# Patient Record
Sex: Female | Born: 1945 | Race: White | Hispanic: No | Marital: Married | Smoking: Former smoker
Health system: Southern US, Community
[De-identification: ages and names within clinical notes are randomized; demographics above are authoritative.]

## PROBLEM LIST (undated history)

## (undated) DIAGNOSIS — M1990 Unspecified osteoarthritis, unspecified site: Secondary | ICD-10-CM

## (undated) DIAGNOSIS — K219 Gastro-esophageal reflux disease without esophagitis: Secondary | ICD-10-CM

## (undated) DIAGNOSIS — M25559 Pain in unspecified hip: Secondary | ICD-10-CM

## (undated) DIAGNOSIS — R32 Unspecified urinary incontinence: Secondary | ICD-10-CM

## (undated) DIAGNOSIS — H3530 Unspecified macular degeneration: Secondary | ICD-10-CM

## (undated) DIAGNOSIS — J439 Emphysema, unspecified: Secondary | ICD-10-CM

## (undated) DIAGNOSIS — I10 Essential (primary) hypertension: Secondary | ICD-10-CM

## (undated) DIAGNOSIS — J449 Chronic obstructive pulmonary disease, unspecified: Secondary | ICD-10-CM

## (undated) DIAGNOSIS — G8929 Other chronic pain: Secondary | ICD-10-CM

## (undated) DIAGNOSIS — R609 Edema, unspecified: Secondary | ICD-10-CM

## (undated) DIAGNOSIS — C801 Malignant (primary) neoplasm, unspecified: Secondary | ICD-10-CM

## (undated) DIAGNOSIS — E785 Hyperlipidemia, unspecified: Secondary | ICD-10-CM

## (undated) HISTORY — DX: Unspecified urinary incontinence: R32

## (undated) HISTORY — DX: Hyperlipidemia, unspecified: E78.5

## (undated) HISTORY — PX: CHOLECYSTECTOMY: SHX55

## (undated) HISTORY — PX: ADENOIDECTOMY: SUR15

## (undated) HISTORY — DX: Emphysema, unspecified: J43.9

## (undated) HISTORY — PX: TONSILLECTOMY: SUR1361

---

## 1996-04-29 HISTORY — PX: ROTATOR CUFF REPAIR: SHX139

## 2009-04-29 HISTORY — PX: WHIPPLE PROCEDURE: SHX2667

## 2017-02-06 DIAGNOSIS — D126 Benign neoplasm of colon, unspecified: Secondary | ICD-10-CM | POA: Insufficient documentation

## 2018-06-07 ENCOUNTER — Emergency Department (HOSPITAL_COMMUNITY): Payer: Medicare Other

## 2018-06-07 ENCOUNTER — Encounter (HOSPITAL_COMMUNITY): Payer: Self-pay | Admitting: Emergency Medicine

## 2018-06-07 ENCOUNTER — Other Ambulatory Visit: Payer: Self-pay

## 2018-06-07 ENCOUNTER — Emergency Department (HOSPITAL_COMMUNITY)
Admission: EM | Admit: 2018-06-07 | Discharge: 2018-06-07 | Disposition: A | Discharge status: Home / Self Care | Payer: Medicare Other | Attending: Emergency Medicine | Admitting: Emergency Medicine

## 2018-06-07 DIAGNOSIS — I10 Essential (primary) hypertension: Secondary | ICD-10-CM | POA: Insufficient documentation

## 2018-06-07 DIAGNOSIS — Z859 Personal history of malignant neoplasm, unspecified: Secondary | ICD-10-CM | POA: Insufficient documentation

## 2018-06-07 DIAGNOSIS — R0602 Shortness of breath: Secondary | ICD-10-CM | POA: Diagnosis present

## 2018-06-07 DIAGNOSIS — J449 Chronic obstructive pulmonary disease, unspecified: Secondary | ICD-10-CM | POA: Insufficient documentation

## 2018-06-07 DIAGNOSIS — Z87891 Personal history of nicotine dependence: Secondary | ICD-10-CM | POA: Diagnosis not present

## 2018-06-07 DIAGNOSIS — J441 Chronic obstructive pulmonary disease with (acute) exacerbation: Secondary | ICD-10-CM | POA: Diagnosis not present

## 2018-06-07 DIAGNOSIS — Z79899 Other long term (current) drug therapy: Secondary | ICD-10-CM | POA: Insufficient documentation

## 2018-06-07 HISTORY — DX: Chronic obstructive pulmonary disease, unspecified: J44.9

## 2018-06-07 HISTORY — DX: Malignant (primary) neoplasm, unspecified: C80.1

## 2018-06-07 HISTORY — DX: Unspecified osteoarthritis, unspecified site: M19.90

## 2018-06-07 HISTORY — DX: Gastro-esophageal reflux disease without esophagitis: K21.9

## 2018-06-07 HISTORY — DX: Unspecified macular degeneration: H35.30

## 2018-06-07 HISTORY — DX: Other chronic pain: G89.29

## 2018-06-07 HISTORY — DX: Edema, unspecified: R60.9

## 2018-06-07 HISTORY — DX: Essential (primary) hypertension: I10

## 2018-06-07 HISTORY — DX: Pain in unspecified hip: M25.559

## 2018-06-07 LAB — CBC
HCT: 41.3 % (ref 36.0–46.0)
Hemoglobin: 12.4 g/dL (ref 12.0–15.0)
MCH: 27.3 pg (ref 26.0–34.0)
MCHC: 30 g/dL (ref 30.0–36.0)
MCV: 90.8 fL (ref 80.0–100.0)
Platelets: 282 10*3/uL (ref 150–400)
RBC: 4.55 MIL/uL (ref 3.87–5.11)
RDW: 14.2 % (ref 11.5–15.5)
WBC: 5.6 10*3/uL (ref 4.0–10.5)
nRBC: 0 % (ref 0.0–0.2)

## 2018-06-07 LAB — BASIC METABOLIC PANEL
Anion gap: 12 (ref 5–15)
BUN: 27 mg/dL — ABNORMAL HIGH (ref 8–23)
CO2: 22 mmol/L (ref 22–32)
Calcium: 9.2 mg/dL (ref 8.9–10.3)
Chloride: 105 mmol/L (ref 98–111)
Creatinine, Ser: 0.79 mg/dL (ref 0.44–1.00)
GFR calc Af Amer: >60 mL/min (ref >60)
GFR calc non Af Amer: >60 mL/min (ref >60)
Glucose, Bld: 106 mg/dL — ABNORMAL HIGH (ref 70–99)
Potassium: 4.3 mmol/L (ref 3.5–5.1)
Sodium: 139 mmol/L (ref 135–145)

## 2018-06-07 LAB — BRAIN NATRIURETIC PEPTIDE: B Natriuretic Peptide: 49 pg/mL (ref 0.0–100.0)

## 2018-06-07 LAB — TROPONIN I: Troponin I: <0.03 ng/mL (ref <0.03)

## 2018-06-07 MED ORDER — ALBUTEROL SULFATE (2.5 MG/3ML) 0.083% IN NEBU: 5.0000 mg | INHALATION_SOLUTION | Freq: Once | RESPIRATORY_TRACT | Status: DC

## 2018-06-07 NOTE — ED Triage Notes (Signed)
Patient c/o shortness of breath that started today. Per patient hx of COPD and borderline CHF. Patient uses nebulizer treatments at home-last used approx 1 hour prior to coming to ED. Denies any chest pain. Patient states "I have been slack on using my neb treatments because I have been doing so well." Occasional cough reported. Patient does have bilateral swelling in lower legs.

## 2018-06-07 NOTE — Discharge Instructions (Signed)
Use your nebulizer treatment that you have at home every 6 hours for the next 7 days at least.  Today's work-up without evidence of pneumonia.  No evidence of any fluid on the lungs no evidence of congestive heart failure.  Follow-up with your doctor for any new or worse symptoms.

## 2018-06-07 NOTE — ED Provider Notes (Signed)
[facility] EMERGENCY DEPARTMENT Provider Note   CSN: [id number] Arrival date & time: 06/07/18  1753     History   Chief Complaint Chief Complaint  Patient presents with  . Shortness of Breath    HPI Vicki Kennedy is a 73 y.o. female.  Patient had a sudden episode of shortness of breath earlier today.  She has had some leg swelling to when she was worried about congestive heart failure she is been told in the past she has borderline congestive heart failure.  She also has a history of COPD.  Patient did use a nebulizer treatment about 1 hour prior to coming and does feel better.  Denies any chest pain.  Patient states she has not been good about using her nebulizers like she is supposed to.     Past Medical History:  Diagnosis Date  . Arthritis   . Cancer ([place])   . Chronic hip pain   . COPD (chronic obstructive pulmonary disease) ([place])   . Edema   . GERD (gastroesophageal reflux disease)   . Hypertension   . Macular degeneration     There are no active problems to display for this patient.   Past Surgical History:  Procedure Laterality Date  . ADENOIDECTOMY    . CHOLECYSTECTOMY    . TONSILLECTOMY       OB History    Gravida  1   Para  1   Term  1   Preterm      AB      Living  1     SAB      TAB      Ectopic      Multiple      Live Births               Home Medications    Prior to Admission medications   Medication Sig Start Date End Date Taking? Authorizing Provider  amLODipine-benazepril (LOTREL) 5-20 MG capsule Take 1 capsule by mouth daily. 05/05/18  Yes [provider]  B Complex Vitamins (VITAMIN B-COMPLEX) TABS Take 1 tablet by mouth daily.   Yes [provider]  Biotin 1000 MCG tablet Take 1 tablet by mouth daily.   Yes [provider]  budesonide (PULMICORT) 0.5 MG/2ML nebulizer solution Inhale 0.5 mg into the lungs 2 (two) times daily.   Yes [provider]  bumetanide (BUMEX) 1 MG tablet  Take 1 tablet by mouth daily. 05/20/18  Yes [provider]  buprenorphine (BUTRANS) 5 MCG/HR [place] 1 patch onto the skin once a week. 05/29/18  Yes [provider]  Cholecalciferol (VITAMIN D-1000 MAX ST) 25 MCG (1000 UT) tablet Take 1 tablet by mouth daily.   Yes [provider]  clonazePAM (KLONOPIN) 0.5 MG tablet Take 1 tablet by mouth 2 (two) times daily. 05/27/18  Yes [provider]  diclofenac sodium (VOLTAREN) 1 % GEL Apply 1 application topically 2 (two) times daily as needed for pain. 02/26/18  Yes [provider]  FLUoxetine (PROZAC) 40 MG capsule Take 1 capsule by mouth daily. 03/29/18  Yes [provider]  furosemide (LASIX) 40 MG tablet Take 1 tablet by mouth 2 (two) times daily. 04/27/18  Yes [provider]  gabapentin (NEURONTIN) 300 MG capsule Take 1 capsule by mouth 3 (three) times daily. 05/27/18  Yes [provider]  rOPINIRole (REQUIP) 0.5 MG tablet Take 1 tablet by mouth daily. 04/08/18  Yes [provider]  traMADol (ULTRAM) 50 MG tablet  Take 1 tablet by mouth 3 (three) times daily. 06/05/18  Yes [provider]    Family History Family History  Problem Relation Age of Onset  . Cancer Father   . Cancer Other   . Asthma Other   . Heart failure Other     Social History Social History   Tobacco Use  . Smoking status: Former Smoker    Packs/day: 1.00    Years: 40.00    Pack years: 40.00    Types: Cigarettes    Last attempt to quit: 09/17/2009    Years since quitting: 8.7  . Smokeless tobacco: Never Used  Substance Use Topics  . Alcohol use: Never    Frequency: Never  . Drug use: Never     Allergies   Aspirin; Demerol [meperidine hcl]; Ibuprofen; and Ivp dye [iodinated diagnostic agents]   Review of Systems Review of Systems  Constitutional: Negative for chills and fever.  HENT: Negative for rhinorrhea and sore throat.   Eyes: Negative for visual disturbance.    Respiratory: Positive for shortness of breath. Negative for cough.   Cardiovascular: Positive for leg swelling. Negative for chest pain.  Gastrointestinal: Negative for abdominal pain, diarrhea, nausea and vomiting.  Genitourinary: Negative for dysuria.  Musculoskeletal: Negative for back pain and neck pain.  Skin: Negative for rash.  Neurological: Negative for dizziness, light-headedness and headaches.  Hematological: Does not bruise/bleed easily.  Psychiatric/Behavioral: Negative for confusion.     Physical Exam Updated Vital Signs BP (!) 106/48   Pulse 77   Resp (!) 22   Ht 1.575 m (5\' 2" )   Wt 90.3 kg   SpO2 96%   BMI 36.40 kg/m   Physical Exam Vitals signs and nursing note reviewed.  Constitutional:      General: She is not in acute distress.    Appearance: She is well-developed.  HENT:     Head: Normocephalic and atraumatic.     Nose: Congestion present.     Mouth/Throat:     Mouth: Mucous membranes are moist.  Eyes:     Conjunctiva/sclera: Conjunctivae normal.  Neck:     Musculoskeletal: Neck supple.  Cardiovascular:     Rate and Rhythm: Normal rate and regular rhythm.     Heart sounds: No murmur.  Pulmonary:     Effort: Pulmonary effort is normal. No respiratory distress.     Breath sounds: Normal breath sounds. No wheezing.  Abdominal:     General: Bowel sounds are normal.     Palpations: Abdomen is soft.     Tenderness: There is no abdominal tenderness.  Musculoskeletal: Normal range of motion.     Right lower leg: Edema present.     Left lower leg: Edema present.  Skin:    General: Skin is warm and dry.     Capillary Refill: Capillary refill takes less than 2 seconds.  Neurological:     General: No focal deficit present.     Mental Status: She is alert and oriented to person, place, and time.      ED Treatments / Results  Labs (all labs ordered are listed, but only abnormal results are displayed) Labs Reviewed  BASIC METABOLIC PANEL -  Abnormal; Notable for the following components:      Result Value   Glucose, Bld 106 (*)    BUN 27 (*)    All other components within normal limits  TROPONIN I  CBC  BRAIN NATRIURETIC PEPTIDE    EKG EKG Interpretation  Date/Time:  Sunday June 07 2018 18:38:27 EST Ventricular Rate:  77 PR Interval:    QRS Duration: 91 QT Interval:  398 QTC Calculation: 451 R Axis:   24 Text Interpretation:  Sinus rhythm Low voltage, precordial leads No previous ECGs available Interpretation limited secondary to artifact Confirmed by Fredia Sorrow [phone]) on 06/07/2018 6:43:00 PM   Radiology Dg Chest 2 View  Result Date: 06/07/2018 CLINICAL DATA:  Patient with shortness of breath. EXAM: CHEST - 2 VIEW COMPARISON:  None. FINDINGS: Monitoring leads overlie the patient. Cardiomegaly. Aortic atherosclerosis. Bibasilar heterogeneous opacities. No pleural effusion or pneumothorax. Age-indeterminate midthoracic spine compression deformity. IMPRESSION: Age-indeterminate compression deformity midthoracic spine, recommend correlation for point tenderness. Cardiomegaly. Basilar opacities which may represent atelectasis or infection. Electronically Signed   By: Lovey Newcomer M.D.   On: 06/07/2018 19:09   Ct Chest Wo Contrast  Result Date: 06/07/2018 CLINICAL DATA:  Shortness of breath, history of COPD EXAM: CT CHEST WITHOUT CONTRAST TECHNIQUE: Multidetector CT imaging of the chest was performed following the standard protocol without IV contrast. COMPARISON:  Chest x-ray from earlier in the same day. FINDINGS: Cardiovascular: Atherosclerotic calcification of the thoracic aorta and its branches are noted. No aneurysmal dilatation is seen. Heavy coronary calcifications are noted. No cardiac enlargement is seen. Mediastinum/Nodes: Thoracic inlet is within normal limits. No significant hilar or mediastinal adenopathy is noted. Scattered small mediastinal nodes are noted. The esophagus is within normal limits.  Lungs/Pleura: The lungs are well aerated bilaterally. No focal infiltrate or sizable effusion is noted. No sizable parenchymal nodule is seen. Some minimal linear density is noted in the left lower lobe posteriorly as well as in the right upper lobe likely related to scarring. No effusion or pneumothorax is seen. Upper Abdomen: Postsurgical changes are noted within the stomach. The gallbladder has been surgically removed. Renal cyst is noted on the right. Small anterior abdominal wall hernia is noted without incarceration. Musculoskeletal: Degenerative changes of the thoracic spine are noted. T7 compression fracture is noted which appears chronic. Old healed rib fractures are noted. No acute bony abnormality is seen. IMPRESSION: Chronic changes as described above.  No acute abnormality noted. Aortic Atherosclerosis (ICD10-I70.0). Electronically Signed   By: Inez Catalina M.D.   On: 06/07/2018 22:34    Procedures Procedures (including critical care time)  Medications Ordered in ED Medications - No data to display   Initial Impression / Assessment and Plan / ED Course  I have reviewed the triage vital signs and the nursing notes.  Pertinent labs & imaging results that were available during my care of the patient were reviewed by me and considered in my medical decision making (see chart for details).    Patient remained without any wheezing here at all.  Extensive work-up to evaluate for possible pneumonia or CHF.  Patient BNP was not elevated.  Chest x-ray raise some concern about may be early pneumonia.  CT of the chest though ruled that out.  Feel that patient probably an exacerbation of her COPD.  Recommend she use her nebulizer treatments every 6 hours that she has at home for the next 7 days and to follow-up with her doctor.  No evidence of any CHF.  Patient does have bilateral leg swelling.  But renal function was also normal.  She does have a follow-up appointment with her primary care doctor  regarding the leg swelling as well.  No clinical concern for pulmonary embolus.  Patient without any  tachycardia and oxygen saturations at all been in  the mid to upper 90s.  On room air.  Final Clinical Impressions(s) / ED Diagnoses   Final diagnoses:  COPD exacerbation [facility])    ED Discharge Orders    None       Fredia Sorrow, MD 06/07/18 2327

## 2018-06-07 NOTE — ED Notes (Signed)
Pt transported to Xray. 

## 2019-11-08 LAB — HEPATIC FUNCTION PANEL
ALT: 27 (ref 7–35)
AST: 32 (ref 13–35)
Alkaline Phosphatase: 156 — AB (ref 25–125)
Bilirubin, Total: 0.2

## 2019-11-08 LAB — COMPREHENSIVE METABOLIC PANEL
Albumin: 3.5 (ref 3.5–5.0)
Calcium: 8.2 — AB (ref 8.7–10.7)
GFR calc non Af Amer: 155

## 2019-11-08 LAB — LIPID PANEL
Cholesterol: 118 (ref 0–200)
HDL: 43 (ref 35–70)
LDL Cholesterol: 44
Triglycerides: 183 — AB (ref 40–160)

## 2019-11-08 LAB — BASIC METABOLIC PANEL
BUN: 14 (ref 4–21)
CO2: 22 (ref 13–22)
Chloride: 101 (ref 99–108)
Creatinine: 0.4 — AB (ref 0.5–1.1)
Glucose: 119
Potassium: 5.1 (ref 3.4–5.3)
Sodium: 134 — AB (ref 137–147)

## 2019-11-08 LAB — CBC AND DIFFERENTIAL
HCT: 38 (ref 36–46)
Platelets: 456 — AB (ref 150–399)
WBC: 6.3

## 2019-11-08 LAB — POCT ERYTHROCYTE SEDIMENTATION RATE, NON-AUTOMATED: Sed Rate: 38

## 2019-11-08 LAB — POCT INR: INR: 1.3 — AB (ref 0.9–1.1)

## 2019-11-11 ENCOUNTER — Telehealth: Payer: Self-pay | Admitting: General Practice

## 2019-11-11 LAB — CBC AND DIFFERENTIAL: Hemoglobin: 12.5 (ref 12.0–16.0)

## 2019-11-11 NOTE — Telephone Encounter (Signed)
Pt wants to know if you will accept her as a new patient. Her daughter in law is Estill Bamberg the NP student.

## 2019-11-23 ENCOUNTER — Ambulatory Visit (INDEPENDENT_AMBULATORY_CARE_PROVIDER_SITE_OTHER): Payer: Medicare Other | Admitting: Internal Medicine

## 2019-11-23 ENCOUNTER — Other Ambulatory Visit: Payer: Self-pay

## 2019-11-23 ENCOUNTER — Encounter: Payer: Self-pay | Admitting: Internal Medicine

## 2019-11-23 DIAGNOSIS — J411 Mucopurulent chronic bronchitis: Secondary | ICD-10-CM | POA: Diagnosis not present

## 2019-11-23 DIAGNOSIS — Z9181 History of falling: Secondary | ICD-10-CM | POA: Insufficient documentation

## 2019-11-23 DIAGNOSIS — J449 Chronic obstructive pulmonary disease, unspecified: Secondary | ICD-10-CM | POA: Insufficient documentation

## 2019-11-23 MED ORDER — ARFORMOTEROL TARTRATE 15 MCG/2ML IN NEBU: 15.0000 ug | INHALATION_SOLUTION | Freq: Two times a day (BID) | RESPIRATORY_TRACT | 1 refills | Status: DC

## 2019-11-23 MED ORDER — REVEFENACIN 175 MCG/3ML IN SOLN: 175.0000 ug | Freq: Every day | RESPIRATORY_TRACT | 1 refills | Status: DC

## 2019-11-23 NOTE — Patient Instructions (Signed)

## 2019-11-23 NOTE — Progress Notes (Signed)
Subjective:  Patient ID: Vicki Kennedy, female    DOB: 03/28/1946  Age: 74 y.o. MRN: [id number]  CC: COPD  NEW TO ME  HPI Vicki Kennedy presents for establishing is a primary care patient.  She comes in with her son and daughter-in-law.  She is had several admissions over the last few months for exacerbations of COPD.  She is currently using a Diskus device but tells me she does not think she is getting much of the ICS or LABA into her lungs.  She has also been using an inhaled corticosteroid through a nebulizer which she does not think is helped much.  During her recent admission she had a chest x-ray (11/10/19) that showed low lung volumes, normal heart size, coarse lung markings, no consolidation, no effusion no pneumothorax, and low bone density.  There are also some some degenerative changes in the spine and shoulders.  She continues to complain of cough that produces clear phlegm with wheezing and shortness of breath.  She has chronic left lower extremity edema that is felt related to a dislocated left hip.  She is trying to get a total hip replacement performed at a tertiary care center.  She and the family tell me she has been evaluated for CHF and pulmonary emboli and the work-up has been unremarkable.  There is no edema in the right lower extremity.  History Shandell has a past medical history of Arthritis, Cancer ([place]), Chronic hip pain, COPD (chronic obstructive pulmonary disease) ([place]), Edema, Emphysema lung ([place]), GERD (gastroesophageal reflux disease), Hyperlipidemia, Hypertension, Macular degeneration, and Urine incontinence.   She has a past surgical history that includes Cholecystectomy; Tonsillectomy; Adenoidectomy; Rotator cuff repair (Right, 1998); and Whipple procedure (2011).   Her family history includes Alcohol abuse in her father; Asthma in an other family member; COPD in her father; Cancer in her father and another family member; Hearing loss in her mother; Heart failure in an  other family member; Hypertension in her father.She reports that she quit smoking about 10 years ago. Her smoking use included cigarettes. She has a 40.00 pack-year smoking history. She has never used smokeless tobacco. She reports that she does not drink alcohol and does not use drugs.  Outpatient Medications Prior to Visit  Medication Sig Dispense Refill  . apixaban (ELIQUIS) 5 MG TABS tablet Take by mouth.    Marland Kitchen atorvastatin (LIPITOR) 40 MG tablet Take by mouth.    . denosumab (PROLIA) 60 MG/ML SOSY injection Inject into the skin.    Marland Kitchen omeprazole (PRILOSEC) 20 MG capsule Take by mouth.    . polyethylene glycol powder (GLYCOLAX/MIRALAX) 17 GM/SCOOP powder Take by mouth.    Marland Kitchen ipratropium-albuterol (DUONEB) 0.5-2.5 (3) MG/3ML SOLN Inhale into the lungs.    . B Complex Vitamins (VITAMIN B-COMPLEX) TABS Take 1 tablet by mouth daily.    . Biotin 1000 MCG tablet Take 1 tablet by mouth daily.    . bumetanide (BUMEX) 1 MG tablet Take 1 tablet by mouth daily.    . buprenorphine (BUTRANS) 5 MCG/HR PT[facility] Place 1 patch onto the skin once a week.    . calcium carbonate (OSCAL) 1500 (600 Ca) MG TABS tablet Take by mouth.    . Cholecalciferol (VITAMIN D-1000 MAX ST) 25 MCG (1000 UT) tablet Take 1 tablet by mouth daily.    . clonazePAM (KLONOPIN) 0.5 MG tablet Take 1 tablet by mouth 2 (two) times daily.    Marland Kitchen docusate sodium (COLACE) 100 MG capsule Take 100 mg by mouth  2 (two) times daily as needed.    Marland Kitchen FLUoxetine (PROZAC) 40 MG capsule Take 1 capsule by mouth daily.    . furosemide (LASIX) 40 MG tablet Take 1 tablet by mouth 2 (two) times daily.    Marland Kitchen gabapentin (NEURONTIN) 300 MG capsule Take 1 capsule by mouth 3 (three) times daily.    Marland Kitchen lactulose (CHRONULAC) 10 GM/15ML solution Take 10 g by mouth daily.    . metoprolol tartrate (LOPRESSOR) 25 MG tablet Take 25 mg by mouth 2 (two) times daily.    Marland Kitchen rOPINIRole (REQUIP) 0.5 MG tablet Take 1 tablet by mouth daily.    . traMADol (ULTRAM) 50 MG tablet Take 1  tablet by mouth 3 (three) times daily.    Marland Kitchen amLODipine-benazepril (LOTREL) 5-20 MG capsule Take 1 capsule by mouth daily.    Marland Kitchen aspirin-acetaminophen-caffeine (EXCEDRIN MIGRAINE) 250-250-65 MG tablet Take by mouth.    . budesonide (PULMICORT) 0.5 MG/2ML nebulizer solution Inhale 0.5 mg into the lungs 2 (two) times daily.    . diclofenac sodium (VOLTAREN) 1 % GEL Apply 1 application topically 2 (two) times daily as needed for pain.    Marland Kitchen Fluticasone-Salmeterol (ADVAIR) 250-50 MCG/DOSE AEPB Inhale into the lungs.     No facility-administered medications prior to visit.    ROS Review of Systems  Constitutional: Positive for fatigue. Negative for chills, diaphoresis and unexpected weight change.  HENT: Negative for trouble swallowing.   Eyes: Negative.   Respiratory: Positive for cough, shortness of breath and wheezing.   Cardiovascular: Positive for leg swelling. Negative for chest pain and palpitations.  Gastrointestinal: Negative for abdominal pain, constipation, diarrhea, nausea and vomiting.  Endocrine: Negative.   Genitourinary: Negative.  Negative for difficulty urinating.  Musculoskeletal: Positive for arthralgias. Negative for myalgias.  Skin: Negative.  Negative for color change and pallor.  Neurological: Positive for weakness. Negative for light-headedness and numbness.  Hematological: Negative for adenopathy. Does not bruise/bleed easily.  Psychiatric/Behavioral: Positive for dysphoric mood. Negative for behavioral problems, confusion, sleep disturbance and suicidal ideas. The patient is nervous/anxious.     Objective:  BP 116/70 (BP Location: Left Arm, Patient Position: Sitting, Cuff Size: Normal)   Pulse 73   Temp 98.9 F (37.2 C) (Oral)   Resp 16   Ht 5\' 2"  (1.575 m)   Wt 180 lb (81.6 kg)   SpO2 96%   BMI 32.92 kg/m   Physical Exam Vitals reviewed.  Constitutional:      Appearance: She is ill-appearing (frail, in a wheelchair).  HENT:     Nose: Nose normal.      Mouth/Throat:     Mouth: Mucous membranes are moist.  Eyes:     General: No scleral icterus.    Conjunctiva/sclera: Conjunctivae normal.  Cardiovascular:     Rate and Rhythm: Normal rate and regular rhythm.     Heart sounds: No murmur heard.  No gallop.   Pulmonary:     Effort: No tachypnea or accessory muscle usage.     Breath sounds: Normal air entry. Examination of the right-upper field reveals decreased breath sounds. Examination of the left-upper field reveals decreased breath sounds. Examination of the right-middle field reveals decreased breath sounds. Examination of the left-middle field reveals decreased breath sounds. Examination of the right-lower field reveals decreased breath sounds. Examination of the left-lower field reveals decreased breath sounds. Decreased breath sounds present. No wheezing, rhonchi or rales.  Abdominal:     General: Abdomen is flat.     Palpations: There is no  mass.     Tenderness: There is no abdominal tenderness. There is no guarding.  Musculoskeletal:     Cervical back: Neck supple.     Right lower leg: No edema.     Left lower leg: 2+ Edema present.  Lymphadenopathy:     Cervical: No cervical adenopathy.  Skin:    General: Skin is warm and dry.  Neurological:     Mental Status: She is alert. Mental status is at baseline.  Psychiatric:        Mood and Affect: Mood normal.        Behavior: Behavior normal.     Lab Results  Component Value Date   WBC 6.3 11/08/2019   HGB 12.5 11/11/2019   HCT 38 11/08/2019   PLT 456 (A) 11/08/2019   GLUCOSE 106 (H) 06/07/2018   CHOL 118 11/08/2019   TRIG 183 (A) 11/08/2019   HDL 43 11/08/2019   LDLCALC 44 11/08/2019   ALT 27 11/08/2019   AST 32 11/08/2019   NA 134 (A) 11/08/2019   K 5.1 11/08/2019   CL 101 11/08/2019   CREATININE 0.4 (A) 11/08/2019   BUN 14 11/08/2019   CO2 22 11/08/2019   INR 1.3 (A) 11/08/2019    Assessment & Plan:   Roisin was seen today for copd.  Diagnoses and all  orders for this visit:  Mucopurulent chronic bronchitis ([place])- I do not think she is benefiting from the Diskus device.  I recommended that she upgrade to a nebulized LABA/LAMA.  I recommended she continue using the inhaled corticosteroid to prevent exacerbations. -     arformoterol (BROVANA) 15 MCG/2ML NEBU; Take 2 mLs (15 mcg total) by nebulization in the morning and at bedtime. -     revefenacin (YUPELRI) 175 MCG/3ML nebulizer solution; Take 3 mLs (175 mcg total) by nebulization daily. -     Ambulatory referral to [place]   I have discontinued Hassan Rowan Lunz's budesonide, amLODipine-benazepril, diclofenac sodium, aspirin-acetaminophen-caffeine, Fluticasone-Salmeterol, and ipratropium-albuterol. I am also having her start on arformoterol and revefenacin. Additionally, I am having her maintain her buprenorphine, clonazePAM, Cholecalciferol, Biotin, Vitamin B-Complex, FLUoxetine, furosemide, gabapentin, bumetanide, rOPINIRole, traMADol, apixaban, atorvastatin, metoprolol tartrate, docusate sodium, lactulose, polyethylene glycol powder, denosumab, omeprazole, and calcium carbonate.  Meds ordered this encounter  Medications  . arformoterol (BROVANA) 15 MCG/2ML NEBU    Sig: Take 2 mLs (15 mcg total) by nebulization in the morning and at bedtime.    Dispense:  360 mL    Refill:  1  . revefenacin (YUPELRI) 175 MCG/3ML nebulizer solution    Sig: Take 3 mLs (175 mcg total) by nebulization daily.    Dispense:  270 mL    Refill:  1     Follow-up: Return in about 3 months (around 02/23/2020).  Scarlette Calico, MD

## 2019-11-24 ENCOUNTER — Encounter: Payer: Self-pay | Admitting: Internal Medicine

## 2019-12-29 ENCOUNTER — Other Ambulatory Visit: Payer: Self-pay | Admitting: Internal Medicine

## 2019-12-29 ENCOUNTER — Telehealth: Payer: Self-pay | Admitting: Internal Medicine

## 2019-12-29 DIAGNOSIS — I10 Essential (primary) hypertension: Secondary | ICD-10-CM

## 2019-12-29 DIAGNOSIS — M159 Polyosteoarthritis, unspecified: Secondary | ICD-10-CM

## 2019-12-29 DIAGNOSIS — F411 Generalized anxiety disorder: Secondary | ICD-10-CM

## 2019-12-29 DIAGNOSIS — M8949 Other hypertrophic osteoarthropathy, multiple sites: Secondary | ICD-10-CM

## 2019-12-29 MED ORDER — METOPROLOL TARTRATE 25 MG PO TABS: 25.0000 mg | ORAL_TABLET | Freq: Two times a day (BID) | ORAL | 1 refills | Status: AC

## 2019-12-29 MED ORDER — CLONAZEPAM 0.5 MG PO TABS: 0.5000 mg | ORAL_TABLET | Freq: Two times a day (BID) | ORAL | 1 refills | Status: DC | PRN

## 2019-12-29 MED ORDER — TRAMADOL HCL 50 MG PO TABS: 50.0000 mg | ORAL_TABLET | Freq: Four times a day (QID) | ORAL | 3 refills | Status: AC | PRN

## 2019-12-29 NOTE — Telephone Encounter (Signed)
     1.Medication Requested metoprolol tartrate (LOPRESSOR) 25 MG tablet traMADol (ULTRAM) 50 MG tablet patient advised Dr Ronnald Ramp may not refill  clonazePAM (KLONOPIN) 0.5 MG tablet patient advised Dr Ronnald Ramp may not refill  2. Pharmacy (Name, Street, City):[id number] - [place], [place] [address] 3. On Med List: yes  4. Last Visit with PCP: 11/23/19  5. Next visit date with PCP:   Agent: Please be advised that RX refills may take up to 3 business days. We ask that you follow-up with your pharmacy.

## 2020-01-13 ENCOUNTER — Encounter: Payer: Self-pay | Admitting: Internal Medicine

## 2020-01-13 DIAGNOSIS — F411 Generalized anxiety disorder: Secondary | ICD-10-CM

## 2020-01-17 MED ORDER — CLONAZEPAM 0.5 MG PO TABS: 0.5000 mg | ORAL_TABLET | Freq: Two times a day (BID) | ORAL | 0 refills | Status: AC | PRN

## 2020-02-03 ENCOUNTER — Ambulatory Visit: Payer: Medicare Other | Admitting: Internal Medicine

## 2020-02-24 IMAGING — CT CT CHEST W/O CM
2 of 4 series · 15 of 36 positions shown, 18 images · non-contrast
Comparison: Chest x-ray from earlier in the same day.

CLINICAL DATA: Shortness of breath, history of COPD

EXAM:
CT CHEST WITHOUT CONTRAST
TECHNIQUE: Multidetector CT imaging of the chest was performed following the
standard protocol without IV contrast.

[Series 2: thorax · axial · 0.81mm/px · z∈[+1104,+1342]mm · 12 of 139 slices shown, 15 images]
[im 10/139  mediastinal]
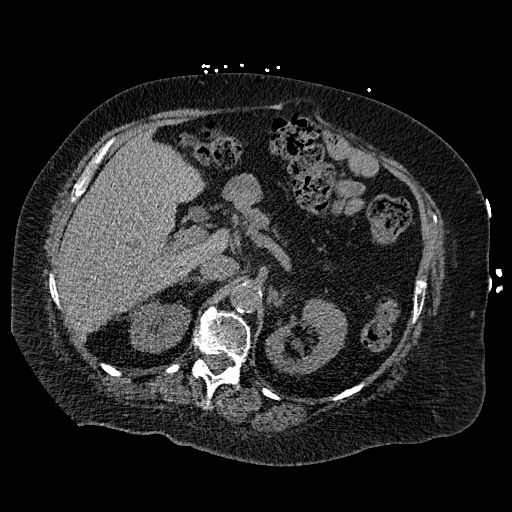
[im 10/139  lung]
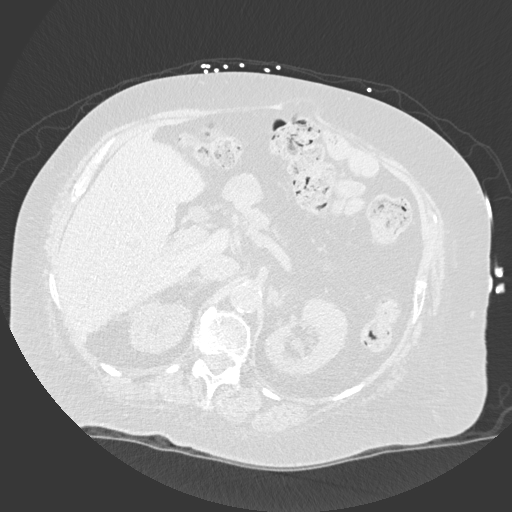
[im 20/139  lung]
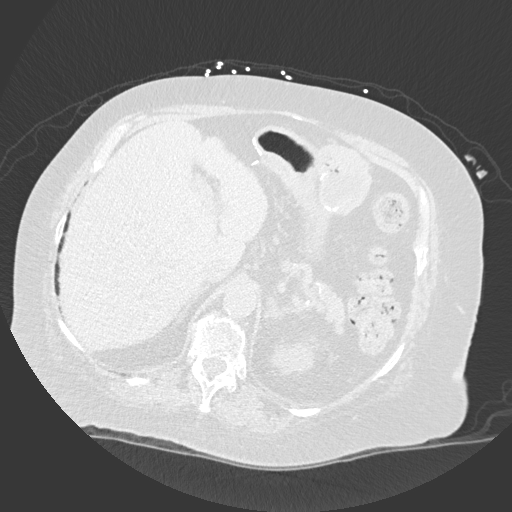
[im 30/139  lung]
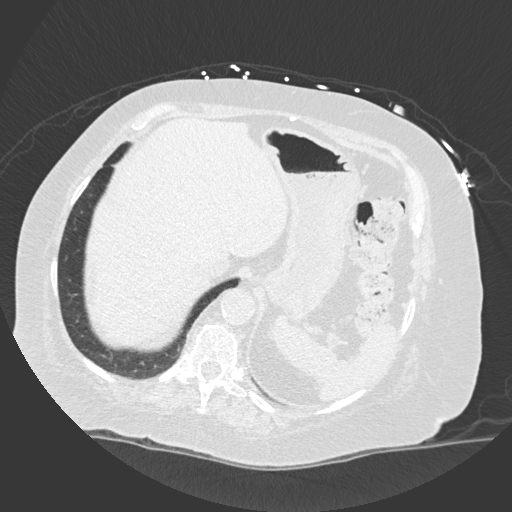
[im 40/139  lung]
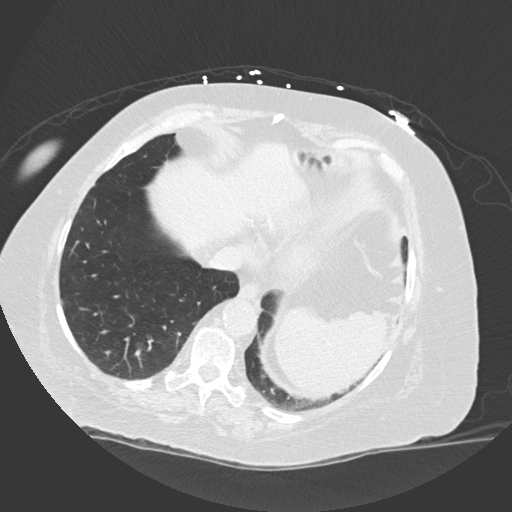
[im 50/139  mediastinal]
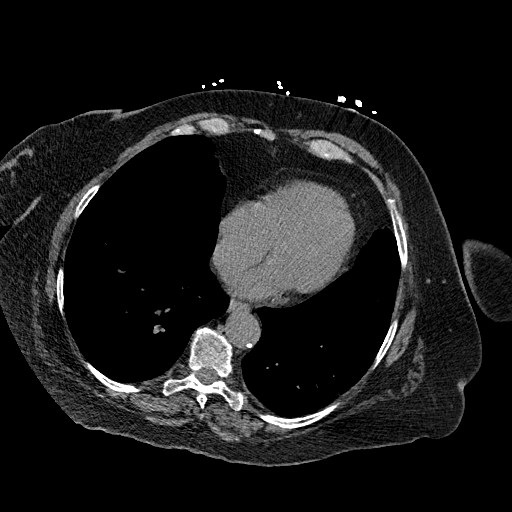
[im 50/139  lung]
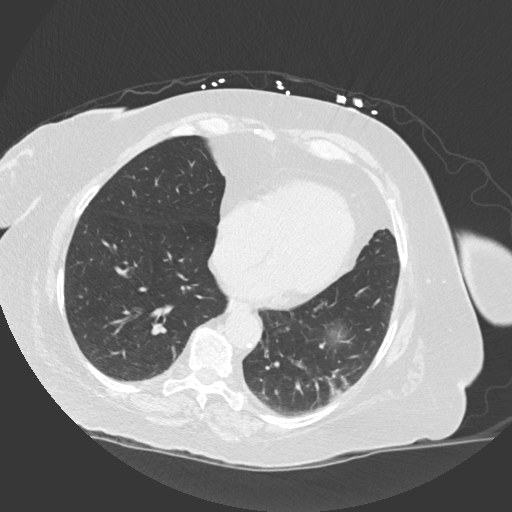
[im 60/139  lung]
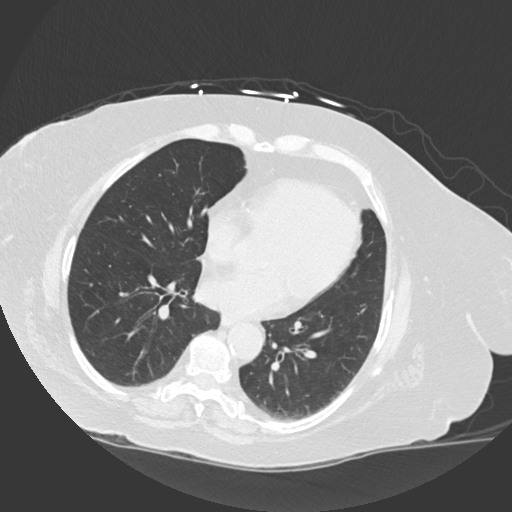
[im 79/139  lung]
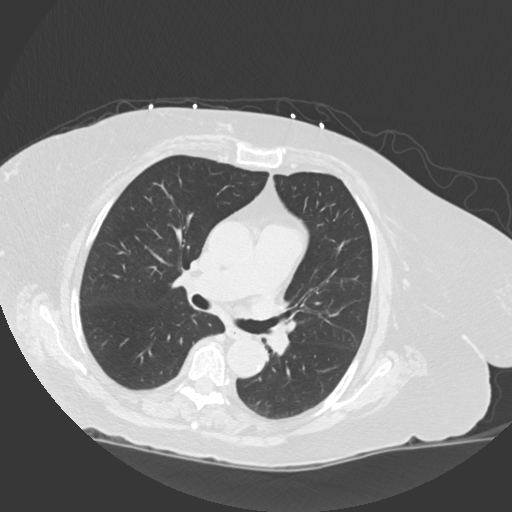
[im 89/139  lung]
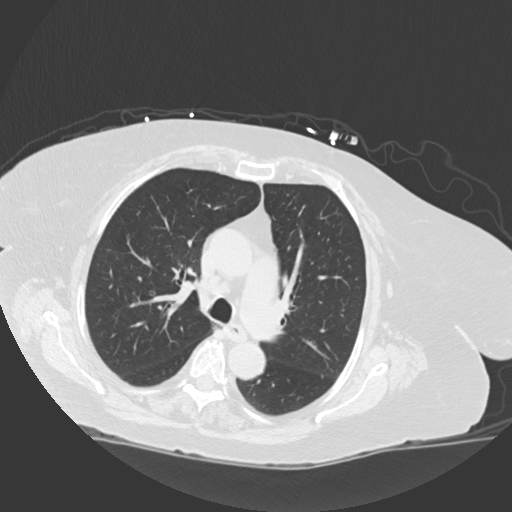
[im 99/139  mediastinal]
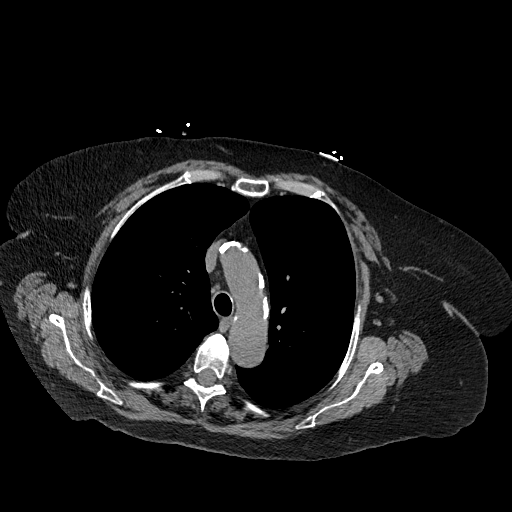
[im 99/139  lung]
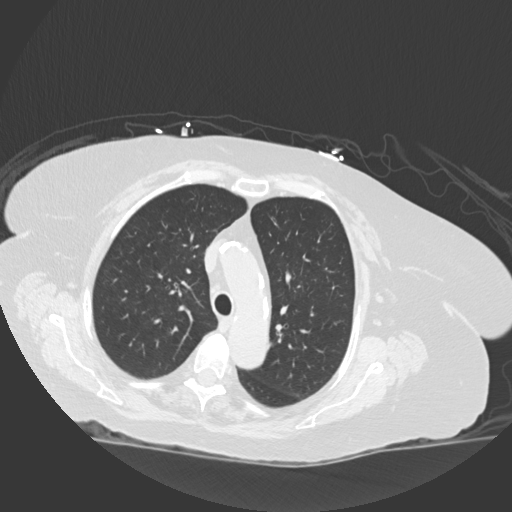
[im 109/139  lung]
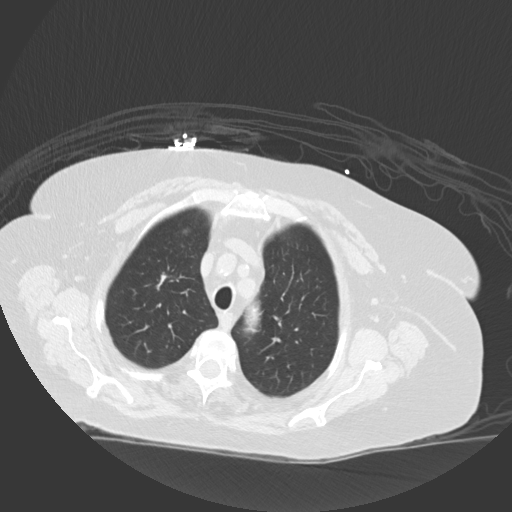
[im 119/139  lung]
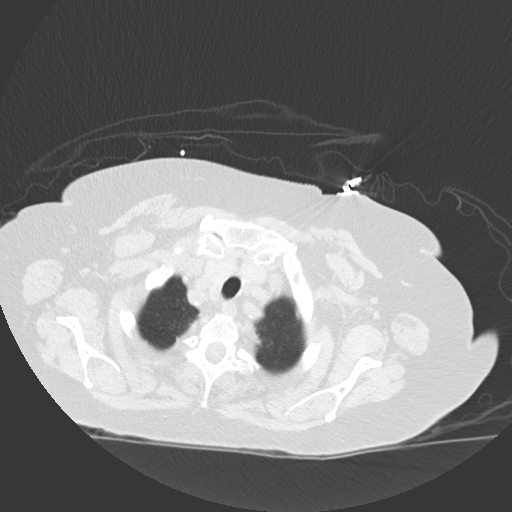
[im 129/139  lung]
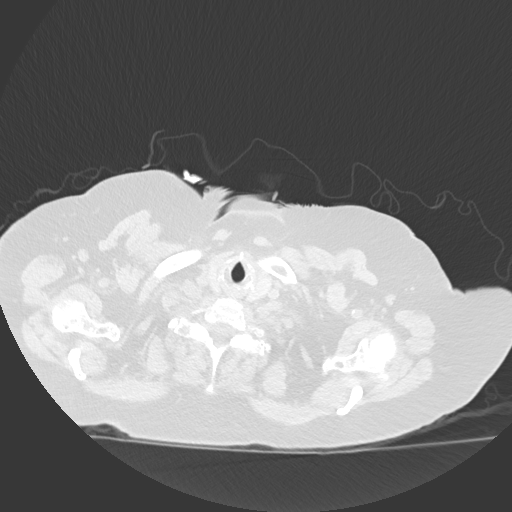

[Series 5: coronal · coronal · 0.59mm/px · 3 of 166 slices shown]
[im 34/166  lung]
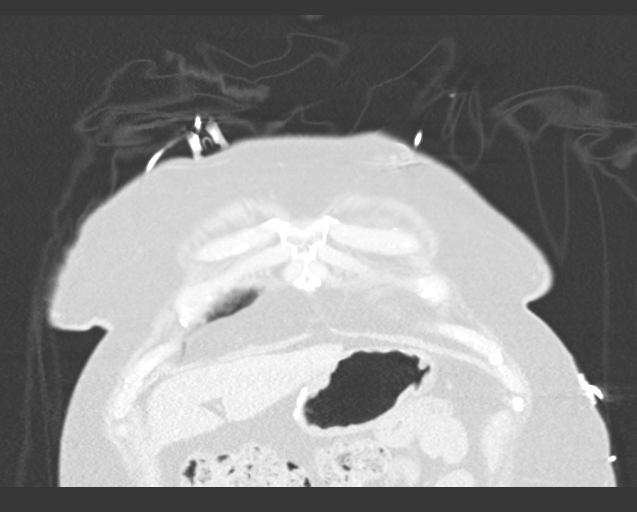
[im 67/166  lung]
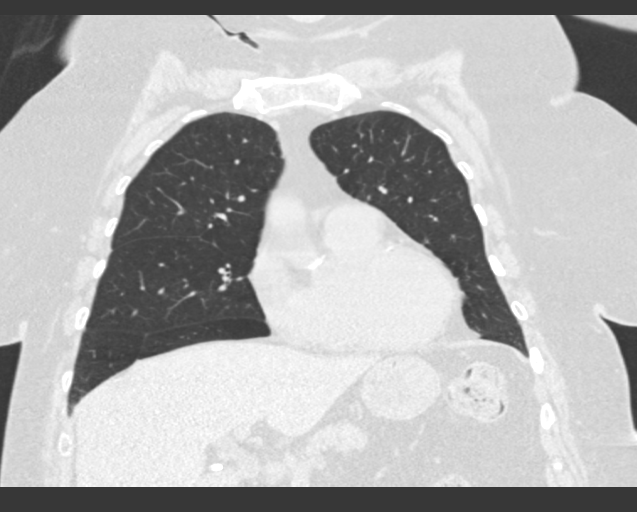
[im 100/166  lung]
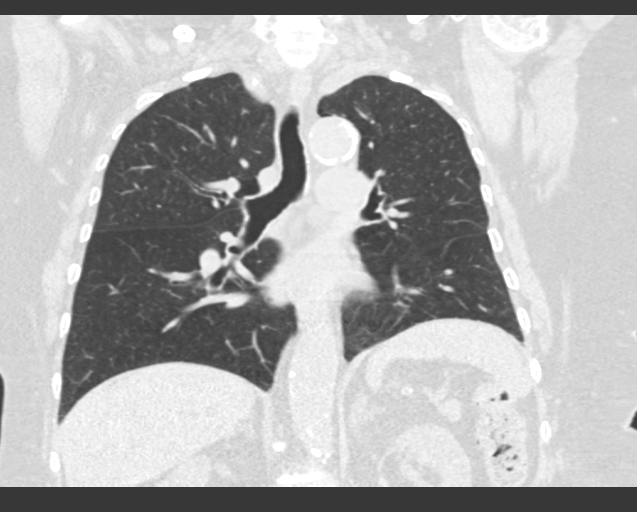

[15 of 36 positions shown; findings below may reference images not displayed]

FINDINGS: Cardiovascular: Atherosclerotic calcification of the thoracic aorta
and its branches are noted. No aneurysmal dilatation is seen. Heavy
coronary calcifications are noted. No cardiac enlargement is seen.

Mediastinum/Nodes: Thoracic inlet is within normal limits. No
significant hilar or mediastinal adenopathy is noted. Scattered
small mediastinal nodes are noted. The esophagus is within normal
limits.

Lungs/Pleura: The lungs are well aerated bilaterally. No focal
infiltrate or sizable effusion is noted. No sizable parenchymal
nodule is seen. Some minimal linear density is noted in the left
lower lobe posteriorly as well as in the right upper lobe likely
related to scarring. No effusion or pneumothorax is seen.

Upper Abdomen: Postsurgical changes are noted within the stomach.
The gallbladder has been surgically removed. Renal cyst is noted on
the right. Small anterior abdominal wall hernia is noted without
incarceration.

Musculoskeletal: Degenerative changes of the thoracic spine are
noted. T7 compression fracture is noted which appears chronic. Old
healed rib fractures are noted. No acute bony abnormality is seen.
IMPRESSION: Chronic changes as described above.  No acute abnormality noted.

Aortic Atherosclerosis ([id number]).

## 2020-03-27 ENCOUNTER — Other Ambulatory Visit: Payer: Self-pay | Admitting: Internal Medicine

## 2020-03-27 DIAGNOSIS — J411 Mucopurulent chronic bronchitis: Secondary | ICD-10-CM

## 2020-08-22 ENCOUNTER — Emergency Department (HOSPITAL_COMMUNITY): Payer: Medicare Other

## 2020-08-22 ENCOUNTER — Encounter (HOSPITAL_COMMUNITY): Payer: Self-pay | Admitting: *Deleted

## 2020-08-22 ENCOUNTER — Other Ambulatory Visit: Payer: Self-pay

## 2020-08-22 ENCOUNTER — Emergency Department (HOSPITAL_COMMUNITY)
Admission: EM | Admit: 2020-08-22 | Discharge: 2020-08-22 | Disposition: A | Discharge status: Home / Self Care | Payer: Medicare Other | Attending: Emergency Medicine | Admitting: Emergency Medicine

## 2020-08-22 DIAGNOSIS — Z87891 Personal history of nicotine dependence: Secondary | ICD-10-CM | POA: Diagnosis not present

## 2020-08-22 DIAGNOSIS — Z85038 Personal history of other malignant neoplasm of large intestine: Secondary | ICD-10-CM | POA: Diagnosis not present

## 2020-08-22 DIAGNOSIS — Z7901 Long term (current) use of anticoagulants: Secondary | ICD-10-CM | POA: Diagnosis not present

## 2020-08-22 DIAGNOSIS — R600 Localized edema: Secondary | ICD-10-CM | POA: Insufficient documentation

## 2020-08-22 DIAGNOSIS — N3000 Acute cystitis without hematuria: Secondary | ICD-10-CM | POA: Diagnosis not present

## 2020-08-22 DIAGNOSIS — B9689 Other specified bacterial agents as the cause of diseases classified elsewhere: Secondary | ICD-10-CM | POA: Diagnosis not present

## 2020-08-22 DIAGNOSIS — I10 Essential (primary) hypertension: Secondary | ICD-10-CM | POA: Insufficient documentation

## 2020-08-22 DIAGNOSIS — J449 Chronic obstructive pulmonary disease, unspecified: Secondary | ICD-10-CM | POA: Insufficient documentation

## 2020-08-22 DIAGNOSIS — R0602 Shortness of breath: Secondary | ICD-10-CM | POA: Diagnosis present

## 2020-08-22 DIAGNOSIS — Z79899 Other long term (current) drug therapy: Secondary | ICD-10-CM | POA: Diagnosis not present

## 2020-08-22 LAB — CBC WITH DIFFERENTIAL/PLATELET
Abs Immature Granulocytes: 0.03 10*3/uL (ref 0.00–0.07)
Basophils Absolute: 0 10*3/uL (ref 0.0–0.1)
Basophils Relative: 1 %
Eosinophils Absolute: 0.3 10*3/uL (ref 0.0–0.5)
Eosinophils Relative: 4 %
HCT: 41.9 % (ref 36.0–46.0)
Hemoglobin: 12.2 g/dL (ref 12.0–15.0)
Immature Granulocytes: 0 %
Lymphocytes Relative: 24 %
Lymphs Abs: 1.9 10*3/uL (ref 0.7–4.0)
MCH: 24.8 pg — ABNORMAL LOW (ref 26.0–34.0)
MCHC: 29.1 g/dL — ABNORMAL LOW (ref 30.0–36.0)
MCV: 85.3 fL (ref 80.0–100.0)
Monocytes Absolute: 0.6 10*3/uL (ref 0.1–1.0)
Monocytes Relative: 8 %
Neutro Abs: 5 10*3/uL (ref 1.7–7.7)
Neutrophils Relative %: 63 %
Platelets: 328 10*3/uL (ref 150–400)
RBC: 4.91 MIL/uL (ref 3.87–5.11)
RDW: 17.6 % — ABNORMAL HIGH (ref 11.5–15.5)
WBC: 7.8 10*3/uL (ref 4.0–10.5)
nRBC: 0 % (ref 0.0–0.2)

## 2020-08-22 LAB — COMPREHENSIVE METABOLIC PANEL
ALT: 19 U/L (ref 0–44)
AST: 19 U/L (ref 15–41)
Albumin: 3.1 g/dL — ABNORMAL LOW (ref 3.5–5.0)
Alkaline Phosphatase: 115 U/L (ref 38–126)
Anion gap: 10 (ref 5–15)
BUN: 15 mg/dL (ref 8–23)
CO2: 31 mmol/L (ref 22–32)
Calcium: 9.1 mg/dL (ref 8.9–10.3)
Chloride: 96 mmol/L — ABNORMAL LOW (ref 98–111)
Creatinine, Ser: 0.6 mg/dL (ref 0.44–1.00)
GFR, Estimated: >60 mL/min (ref >60)
Glucose, Bld: 114 mg/dL — ABNORMAL HIGH (ref 70–99)
Potassium: 3.6 mmol/L (ref 3.5–5.1)
Sodium: 137 mmol/L (ref 135–145)
Total Bilirubin: 0.7 mg/dL (ref 0.3–1.2)
Total Protein: 6.5 g/dL (ref 6.5–8.1)

## 2020-08-22 LAB — URINALYSIS, ROUTINE W REFLEX MICROSCOPIC
Bilirubin Urine: NEGATIVE
Glucose, UA: NEGATIVE mg/dL
Ketones, ur: NEGATIVE mg/dL
Nitrite: POSITIVE — AB
Protein, ur: NEGATIVE mg/dL
Specific Gravity, Urine: 1.009 (ref 1.005–1.030)
pH: 7 (ref 5.0–8.0)

## 2020-08-22 LAB — BASIC METABOLIC PANEL
Anion gap: 10 (ref 5–15)
BUN: 15 mg/dL (ref 8–23)
CO2: 31 mmol/L (ref 22–32)
Calcium: 9.2 mg/dL (ref 8.9–10.3)
Chloride: 96 mmol/L — ABNORMAL LOW (ref 98–111)
Creatinine, Ser: 0.57 mg/dL (ref 0.44–1.00)
GFR, Estimated: >60 mL/min (ref >60)
Glucose, Bld: 116 mg/dL — ABNORMAL HIGH (ref 70–99)
Potassium: 3.7 mmol/L (ref 3.5–5.1)
Sodium: 137 mmol/L (ref 135–145)

## 2020-08-22 LAB — BRAIN NATRIURETIC PEPTIDE: B Natriuretic Peptide: 103 pg/mL — ABNORMAL HIGH (ref 0.0–100.0)

## 2020-08-22 MED ORDER — SODIUM CHLORIDE 0.9 % IV SOLN
1.0000 g | Freq: Once | INTRAVENOUS | Status: AC
  Administered 2020-08-22: 1 g via INTRAVENOUS
  Filled 2020-08-22: qty 10

## 2020-08-22 MED ORDER — ACETAMINOPHEN 160 MG/5ML PO SOLN
500.0000 mg | Freq: Once | ORAL | Status: AC
  Administered 2020-08-22: 500 mg via ORAL
  Filled 2020-08-22: qty 20.3

## 2020-08-22 MED ORDER — FUROSEMIDE 10 MG/ML IJ SOLN
40.0000 mg | Freq: Once | INTRAMUSCULAR | Status: AC
  Administered 2020-08-22: 40 mg via INTRAVENOUS
  Filled 2020-08-22: qty 4

## 2020-08-22 MED ORDER — ACETAMINOPHEN 325 MG PO TABS: 650.0000 mg | ORAL_TABLET | Freq: Once | ORAL | Status: DC | PRN

## 2020-08-22 MED ORDER — CEPHALEXIN 500 MG PO CAPS: 500.0000 mg | ORAL_CAPSULE | Freq: Two times a day (BID) | ORAL | 0 refills | Status: AC

## 2020-08-22 NOTE — ED Provider Notes (Signed)
[facility] Provider Note   CSN: [id number] Arrival date & time: 08/22/20  1239     History Chief Complaint  Patient presents with  . Shortness of Breath    Vicki Kennedy is a 75 y.o. female with friend past medical history of emphysema, COPD, hyperlipidemia, hypertension.   Patient states that her primary care doctor told her to come here today for evaluation of CHF because they thought they heard fluid in her lungs..  States that she has had swollen legs for a year and now feels like that there is fluid in her lungs over the past year.  No worsening today, states that her symptoms have been consistent over the past year.  States that her PCP put her on Lasix which she has been taking.  Has never been diagnosed with CHF.  Patient has had multiple admissions in [place] for COPD exacerbation, last admission was 3 weeks ago according to son.  States that she has been doing well since then.  States that she is had a productive cough for the past year since she was diagnosed with COVID last year.  Denies any chest pain.  Denies any fevers.  Denies any abdominal pain, back pain nausea or vomiting.  No hemoptysis.  No history of DVT or PE, however patient states that she is on Lovenox that she states that she has been taking daily.  Patient states that she is unsure why she is on a blood thinner, son is also unsure.  Does have remote history of pancreatic tumor in 2011, had [facility].Per chart review patient was seen by PCP about 9 months ago, diagnosed with mucopurulent chronic bronchitis.  Was started on nebulized LAMA LABA.  Patient states that she is been compliant with this.  Is on 2 L of oxygen for the past year due to her COPD.  Denies any increase in this.  Denies any fevers or chills.  Denies any erythema to her legs.  Patient is chronically in a wheelchair due to her COPD and other illnesses.  Son also states for the past couple of days home health nursing states that her urine  has been decreased, patient denies any dysuria or urinary retention.  Has been eating and drinking normally.  Is on 40 mg of Lasix 2 times a day.  HPI     Past Medical History:  Diagnosis Date  . Arthritis   . Cancer ([place])   . Chronic hip pain   . COPD (chronic obstructive pulmonary disease) ([place])   . Edema   . Emphysema lung ([place])   . GERD (gastroesophageal reflux disease)   . Hyperlipidemia   . Hypertension   . Macular degeneration   . Urine incontinence     Patient Active Problem List   Diagnosis Date Noted  . GAD (generalized anxiety disorder) 12/29/2019  . Primary osteoarthritis involving multiple joints 12/29/2019  . Hypertension   . At maximum risk for fall 11/23/2019  . COPD (chronic obstructive pulmonary disease) ([place])   . Benign neoplasm of colon, unspecified 02/06/2017    Past Surgical History:  Procedure Laterality Date  . ADENOIDECTOMY    . CHOLECYSTECTOMY    . ROTATOR CUFF REPAIR Right 1998  . TONSILLECTOMY    . WHIPPLE PROCEDURE  2011     OB History    Gravida  1   Para  1   Term  1   Preterm      AB      Living  1     SAB      IAB      Ectopic      Multiple      Live Births              Family History  Problem Relation Age of Onset  . Cancer Father   . Alcohol abuse Father   . COPD Father   . Hypertension Father   . Cancer Other   . Asthma Other   . Heart failure Other   . Hearing loss Mother     Social History   Tobacco Use  . Smoking status: Former Smoker    Packs/day: 1.00    Years: 40.00    Pack years: 40.00    Types: Cigarettes    Quit date: 09/17/2009    Years since quitting: 10.9  . Smokeless tobacco: Never Used  Vaping Use  . Vaping Use: Never used  Substance Use Topics  . Alcohol use: Never  . Drug use: Never    Home Medications Prior to Admission medications   Medication Sig Start Date End Date Taking? Authorizing Provider  atorvastatin (LIPITOR) 40 MG tablet Take 40 mg by mouth daily.  11/14/18  Yes [provider]  Biotin 1000 MCG tablet Take 1 tablet by mouth daily.   Yes [provider]  BROVANA 15 MCG/2ML NEBU USE 1 VIAL  IN  NEBULIZER TWICE  DAILY - Morning and Evening Patient taking differently: Take 15 mcg by nebulization in the morning and at bedtime. 03/27/20  Yes Janith Lima, MD  budesonide (PULMICORT) 0.5 MG/2ML nebulizer solution Take by nebulization 2 (two) times daily. 05/13/20  Yes [provider]  calcium carbonate (OSCAL) 1500 (600 Ca) MG TABS tablet Take 1,500 mg by mouth daily with breakfast.   Yes [provider]  cephALEXin (KEFLEX) 500 MG capsule Take 1 capsule (500 mg total) by mouth 2 (two) times daily for 6 days. 08/22/20 08/28/20 Yes Kadee Philyaw, Deberah Pelton, PA-C  cetirizine (ZYRTEC) 10 MG chewable tablet Chew 10 mg by mouth daily.   Yes [provider]  Cholecalciferol 25 MCG (1000 UT) tablet Take 1 tablet by mouth daily.   Yes [provider]  clonazePAM (KLONOPIN) 0.5 MG tablet Take 1 tablet (0.5 mg total) by mouth 2 (two) times daily as needed for anxiety. Patient taking differently: Take 0.5 mg by mouth 3 (three) times daily as needed for anxiety. 01/17/20  Yes Burns, Claudina Lick, MD  enoxaparin (LOVENOX) 40 MG/0.4ML injection Inject 40 mg into the skin daily. 08/08/20  Yes [provider]  FLUoxetine (PROZAC) 40 MG capsule Take 60 mg by mouth See admin instructions. Take 60 mg tablet by mouth every day 03/29/18  Yes [provider]  furosemide (LASIX) 40 MG tablet Take 1 tablet by mouth 2 (two) times daily. 04/27/18  Yes [provider]  ipratropium-albuterol (DUONEB) 0.5-2.5 (3) MG/3ML SOLN Take 3 mLs by nebulization every 6 (six) hours as needed.   Yes [provider]  Melatonin 10 MG TABS Take 1 tablet by mouth daily.   Yes [provider]  metoprolol tartrate (LOPRESSOR) 25 MG tablet Take 1 tablet (25 mg total) by mouth 2 (two) times daily. 12/29/19  Yes Janith Lima, MD  omeprazole (PRILOSEC) 20 MG capsule Take 20 mg by mouth daily. 11/25/18  Yes [provider]  traMADol (ULTRAM) 50 MG tablet Take 1 tablet (50 mg total) by mouth every 6 (six) hours as needed. 12/29/19  Yes  Janith Lima, MD  YUPELRI 175 MCG/3ML nebulizer solution USE 1 VIAL IN NEBULIZER DAILY Patient taking differently: Take 175 mcg by nebulization daily. 03/27/20  Yes Janith Lima, MD  apixaban (ELIQUIS) 5 MG TABS tablet Take by mouth. Patient not taking: No sig reported 03/03/19   [provider]  B Complex Vitamins (VITAMIN B-COMPLEX) TABS Take 1 tablet by mouth daily. Patient not taking: Reported on 08/22/2020    [provider]  bumetanide (BUMEX) 1 MG tablet Take 1 tablet by mouth daily. Patient not taking: Reported on 08/22/2020 05/20/18   [provider]  clonazePAM (KLONOPIN) 0.5 MG tablet Take 0.5 mg by mouth 3 (three) times daily as needed for anxiety. Patient not taking: Reported on 08/22/2020 10/17/19   [provider]  denosumab (PROLIA) 60 MG/ML SOSY injection Inject into the skin. Patient not taking: Reported on 08/22/2020 02/02/17   [provider]  docusate sodium (COLACE) 100 MG capsule Take 100 mg by mouth 2 (two) times daily as needed. Patient not taking: Reported on 08/22/2020 10/15/19   [provider]  gabapentin (NEURONTIN) 300 MG capsule Take 1 capsule by mouth 3 (three) times daily. Patient not taking: Reported on 08/22/2020 05/27/18   [provider]  lactulose (CHRONULAC) 10 GM/15ML solution Take 10 g by mouth daily. Patient not taking: Reported on 08/22/2020 11/08/19   [provider]  polyethylene glycol powder (GLYCOLAX/MIRALAX) 17 GM/SCOOP powder Take by mouth. Patient not taking: Reported on 08/22/2020 11/04/19   [provider]  rOPINIRole (REQUIP) 0.5 MG tablet Take 1 tablet by mouth daily. Patient not taking: Reported on 08/22/2020 04/08/18   [provider]     Allergies    Celecoxib, Aspirin, Demerol [meperidine hcl], Ibuprofen, Ivp dye [iodinated diagnostic agents], and Penicillins  Review of Systems   Review of Systems  Constitutional: Negative for chills, diaphoresis, fatigue and fever.  HENT: Negative for congestion, sore throat and trouble swallowing.   Eyes: Negative for pain and visual disturbance.  Respiratory: Positive for cough and shortness of breath. Negative for wheezing.   Cardiovascular: Positive for leg swelling. Negative for chest pain and palpitations.  Gastrointestinal: Negative for abdominal distention, abdominal pain, diarrhea, nausea and vomiting.  Genitourinary: Positive for decreased urine volume. Negative for difficulty urinating, dyspareunia, enuresis, flank pain, frequency and vaginal discharge.  Musculoskeletal: Negative for back pain, neck pain and neck stiffness.  Skin: Negative for pallor.  Neurological: Negative for dizziness, speech difficulty, weakness and headaches.  Psychiatric/Behavioral: Negative for confusion.    Physical Exam Updated Vital Signs BP (!) 143/60   Pulse 86   Temp 98.2 F (36.8 C)   Resp (!) 24   SpO2 96%   Physical Exam Constitutional:      General: She is not in acute distress.    Appearance: Normal appearance. She is ill-appearing. She is not toxic-appearing or diaphoretic.  HENT:     Mouth/Throat:     Mouth: Mucous membranes are moist.     Pharynx: Oropharynx is clear.  Eyes:     General: No scleral icterus.    Extraocular Movements: Extraocular movements intact.     Pupils: Pupils are equal, round, and reactive to light.  Cardiovascular:     Rate and Rhythm: Normal rate and regular rhythm.     Pulses: Normal pulses.     Heart sounds: Normal heart sounds.  Pulmonary:     Effort: Pulmonary effort is normal. No respiratory distress.     Breath sounds: Normal breath  sounds. No stridor. No wheezing, rhonchi or rales.     Comments: No respiratory distress, on 2 L of  oxygen. Chest:     Chest wall: No tenderness.  Abdominal:     General: Abdomen is flat. There is distension.     Palpations: Abdomen is soft.     Tenderness: There is no abdominal tenderness. There is no guarding or rebound.  Musculoskeletal:        General: No swelling or tenderness. Normal range of motion.     Cervical back: Normal range of motion and neck supple. No rigidity.     Right lower leg: Edema present.     Left lower leg: Edema present.     Comments: Bilateral lower extremity pitting edema, no erythema or warmth.  Skin:    General: Skin is warm and dry.     Capillary Refill: Capillary refill takes less than 2 seconds.     Coloration: Skin is not pale.  Neurological:     General: No focal deficit present.     Mental Status: She is alert and oriented to person, place, and time.  Psychiatric:        Mood and Affect: Mood normal.        Behavior: Behavior normal.     ED Results / Procedures / Treatments   Labs (all labs ordered are listed, but only abnormal results are displayed) Labs Reviewed  BRAIN NATRIURETIC PEPTIDE - Abnormal; Notable for the following components:      Result Value   B Natriuretic Peptide 103.0 (*)    All other components within normal limits  CBC WITH DIFFERENTIAL/PLATELET - Abnormal; Notable for the following components:   MCH 24.8 (*)    MCHC 29.1 (*)    RDW 17.6 (*)    All other components within normal limits  URINALYSIS, ROUTINE W REFLEX MICROSCOPIC - Abnormal; Notable for the following components:   APPearance CLOUDY (*)    Hgb urine dipstick SMALL (*)    Nitrite POSITIVE (*)    Leukocytes,Ua LARGE (*)    Bacteria, UA MANY (*)    All other components within normal limits  COMPREHENSIVE METABOLIC PANEL - Abnormal; Notable for the following components:   Chloride 96 (*)    Glucose, Bld 114 (*)    Albumin 3.1 (*)    All other components within normal limits  BASIC METABOLIC PANEL - Abnormal; Notable for the following components:    Chloride 96 (*)    Glucose, Bld 116 (*)    All other components within normal limits  URINE CULTURE    EKG EKG Interpretation  Date/Time:  Tuesday August 22 2020 14:25:20 EDT Ventricular Rate:  81 PR Interval:  171 QRS Duration: 89 QT Interval:  381 QTC Calculation: 443 R Axis:   35 Text Interpretation: Sinus rhythm Low voltage, precordial leads No significant change since last tracing Confirmed by Isla Pence [phone]) on 08/22/2020 2:29:40 PM   Radiology DG Chest 1 View  Result Date: 08/22/2020 CLINICAL DATA:  Shortness of breath EXAM: CHEST  1 VIEW COMPARISON:  Chest radiograph and chest CT June 07, 2018. FINDINGS: Lungs are clear. Heart size and pulmonary vascularity are normal. No adenopathy. There is aortic atherosclerosis. There is postoperative change in the proximal left humerus as well as in the proximal right humerus. IMPRESSION: Lungs clear. Cardiac silhouette within normal limits. Aortic Atherosclerosis (ICD10-I70.0). Electronically Signed   By: Lowella Grip III M.D.   On: 08/22/2020 13:51    Procedures Procedures  Medications Ordered in ED Medications  furosemide (LASIX) injection 40 mg (40 mg Intravenous Given 08/22/20 1535)  acetaminophen (TYLENOL) 160 MG/5ML solution 500 mg (500 mg Oral Given 08/22/20 1846)  cefTRIAXone (ROCEPHIN) 1 g in sodium chloride 0.9 % 100 mL IVPB (0 g Intravenous Stopped 08/22/20 1916)    ED Course  I have reviewed the triage vital signs and the nursing notes.  Pertinent labs & imaging results that were available during my care of the patient were reviewed by me and considered in my medical decision making (see chart for details).  Clinical Course as of 08/22/20 1956  Tue Aug 22, 2020  1905 B Natriuretic Peptide(!): 103.0 [SP]    Clinical Course User Index [SP] Alfredia Client, PA-C   MDM Rules/Calculators/A&P                          Arria Naim is a 75 y.o. female with friend past medical history of emphysema, COPD,  hyperlipidemia, hypertension that presents to the ED for SOB.  Patient does appear fluid overloaded, however lungs are clear.  Patient is not in any respiratory distress.  Patient is on Lovenox, low concerns for bilateral DVT at this time since this has been going on for over a year.  No concerns for cellulitis, patient is distally neurovascularly intact.  Will give 40 mg of Lasix IV.  EKG interpreted without no acute findings.  Work-up today unremarkable with CMP without any acute electrolyte derangements, CBC with no leukocytosis or anemia.  BNP 103.  Urinalysis is suggestive of UTI which is most likely why patient is having low urine output, no urinary retention, will treat for this here.  Patient states that she had hives multiple years ago with penicillin, is willing to try Rocephin at this time.  Upon reevaluation patient states that she feels much better, no acute reactions to Rocephin.  We will have patient follow-up with cardiology and strict return precautions given.  Patient also follow-up with PCP for UTI.  Patient and son agreeable with plan, patient to be discharged at this time.  Doubt need for further emergent work up at this time. I explained the diagnosis and have given explicit precautions to return to the ER including for any other new or worsening symptoms. The patient understands and accepts the medical plan as it's been dictated and I have answered their questions. Discharge instructions concerning home care and prescriptions have been given. The patient is STABLE and is discharged to home in good condition.  I discussed this case with my attending physician who cosigned this note including patient's presenting symptoms, physical exam, and planned diagnostics and interventions. Attending physician stated agreement with plan or made changes to plan which were implemented.   Attending physician assessed patient at bedside, Dr. Roderic Palau.   Final Clinical Impression(s) / ED  Diagnoses Final diagnoses:  Bilateral lower extremity edema  Acute cystitis without hematuria    Rx / DC Orders ED Discharge Orders         Ordered    cephALEXin (KEFLEX) 500 MG capsule  2 times daily        08/22/20 1919           Alfredia Client, PA-C 08/22/20 1958    Isla Pence, MD 08/24/20 [phone]

## 2020-08-22 NOTE — Discharge Instructions (Signed)
  You were evaluated in the Emergency Department and after careful evaluation, we did not find any emergent condition requiring admission or further testing in the hospital.   Your exam/testing today was overall reassuring.  Symptoms seem to be due to UTI which we are treating you for, please pick up your medications.  Please follow-up with your PCP in the next couple days for this.  I want to follow-up with the heart doctor, their information is provided, please schedule an appointment with them.  Continue take your Lasix. Please return to the Emergency Department if you experience any worsening of your condition.  Thank you for allowing [place] to be a part of your care. Please speak to your pharmacist about any new medications prescribed today in regards to side effects or interactions with other medications.

## 2020-08-22 NOTE — ED Notes (Signed)
55 ml noted to bladder scan

## 2020-08-22 NOTE — ED Triage Notes (Signed)
Patient has multiple problems. States she has swollen legs and and was advised to come if for possible fluid build up in lungs

## 2020-08-22 NOTE — ED Triage Notes (Signed)
Emergency Medicine Provider Triage Evaluation Note  Bessy Reaney , a 75 y.o. female  was evaluated in triage.  Pt complains of SOB for the last year. On 2L of oxygen from COPD, Not worsened over the last year.   Review of Systems  Positive: SOB Negative: Chest pain  Physical Exam  BP 114/71   Pulse 79   Temp 98.5 F (36.9 C) (Oral)   Resp (!) 24   SpO2 99%  Gen:   Awake, no distress   HEENT:  Atraumatic Resp:  Normal effort Cardiac:  Normal rate Abd:   Nondistended, nontender MSK:   Moves extremities without difficult Neuro:  Speech clear  Medical Decision Making  Medically screening exam initiated at 1:47 PM.  Appropriate orders placed.  Elasha Tess was informed that the remainder of the evaluation will be completed by another provider, this initial triage assessment does not replace that evaluation, and the importance of remaining in the ED until their evaluation is complete.  Clinical Impression  Stable  MSE was initiated and I personally evaluated the patient and placed orders (if any) at  1:47 PM on August 22, 2020.  The patient appears stable so that the remainder of the MSE may be completed by another provider.    Alfredia Client, PA-C 08/22/20 1348

## 2020-08-24 LAB — URINE CULTURE

## 2020-10-11 ENCOUNTER — Other Ambulatory Visit: Payer: Self-pay | Admitting: Internal Medicine

## 2020-10-11 DIAGNOSIS — I10 Essential (primary) hypertension: Secondary | ICD-10-CM
